# Patient Record
Sex: Female | Born: 1965 | Race: White | Hispanic: No | Marital: Married | Smoking: Never smoker
Health system: Southern US, Community
[De-identification: ages and names within clinical notes are randomized; demographics above are authoritative.]

---

## 2015-07-22 ENCOUNTER — Emergency Department (HOSPITAL_COMMUNITY)
Admission: EM | Admit: 2015-07-22 | Discharge: 2015-07-22 | Disposition: A | Discharge status: Home / Self Care | Payer: Self-pay | Attending: Emergency Medicine | Admitting: Emergency Medicine

## 2015-07-22 ENCOUNTER — Encounter (HOSPITAL_COMMUNITY): Payer: Self-pay | Admitting: Emergency Medicine

## 2015-07-22 DIAGNOSIS — Z046 Encounter for general psychiatric examination, requested by authority: Secondary | ICD-10-CM | POA: Insufficient documentation

## 2015-07-22 DIAGNOSIS — Z008 Encounter for other general examination: Secondary | ICD-10-CM

## 2015-07-22 LAB — URINALYSIS, ROUTINE W REFLEX MICROSCOPIC
Bilirubin Urine: NEGATIVE
Glucose, UA: NEGATIVE mg/dL
Hgb urine dipstick: NEGATIVE
KETONES UR: NEGATIVE mg/dL
LEUKOCYTES UA: NEGATIVE
NITRITE: NEGATIVE
PH: 5.5 (ref 5.0–8.0)
Protein, ur: NEGATIVE mg/dL
SPECIFIC GRAVITY, URINE: 1.02 (ref 1.005–1.030)

## 2015-07-22 LAB — CBC
HCT: 43.2 % (ref 36.0–46.0)
Hemoglobin: 14 g/dL (ref 12.0–15.0)
MCH: 29.9 pg (ref 26.0–34.0)
MCHC: 32.4 g/dL (ref 30.0–36.0)
MCV: 92.3 fL (ref 78.0–100.0)
PLATELETS: 241 10*3/uL (ref 150–400)
RBC: 4.68 MIL/uL (ref 3.87–5.11)
RDW: 14.1 % (ref 11.5–15.5)
WBC: 8.1 10*3/uL (ref 4.0–10.5)

## 2015-07-22 LAB — RAPID URINE DRUG SCREEN, HOSP PERFORMED
Amphetamines: NOT DETECTED
Barbiturates: NOT DETECTED
Benzodiazepines: NOT DETECTED
Cocaine: NOT DETECTED
OPIATES: NOT DETECTED
TETRAHYDROCANNABINOL: NOT DETECTED

## 2015-07-22 LAB — COMPREHENSIVE METABOLIC PANEL
ALBUMIN: 3.9 g/dL (ref 3.5–5.0)
ALT: 32 U/L (ref 14–54)
AST: 29 U/L (ref 15–41)
Alkaline Phosphatase: 53 U/L (ref 38–126)
Anion gap: 6 (ref 5–15)
BUN: 10 mg/dL (ref 6–20)
CALCIUM: 8.9 mg/dL (ref 8.9–10.3)
CO2: 24 mmol/L (ref 22–32)
CREATININE: 0.54 mg/dL (ref 0.44–1.00)
Chloride: 108 mmol/L (ref 101–111)
Glucose, Bld: 101 mg/dL — ABNORMAL HIGH (ref 65–99)
Potassium: 3.9 mmol/L (ref 3.5–5.1)
SODIUM: 138 mmol/L (ref 135–145)
Total Bilirubin: 0.8 mg/dL (ref 0.3–1.2)
Total Protein: 7.3 g/dL (ref 6.5–8.1)

## 2015-07-22 LAB — SALICYLATE LEVEL

## 2015-07-22 LAB — ACETAMINOPHEN LEVEL: Acetaminophen (Tylenol), Serum: <10 ug/mL — ABNORMAL LOW (ref 10–30)

## 2015-07-22 LAB — ETHANOL

## 2015-07-22 NOTE — ED Notes (Signed)
Per paperwork-states patient has been diagnosed with psychiatric disorder-[facility]sent here for blood work-patient refusing labs and triage process-states it is against her faith

## 2015-07-22 NOTE — ED Provider Notes (Signed)
CSN: [id number]     Arrival date & time 07/22/15  1114 History   First MD Initiated Contact with Patient 07/22/15 1138     Chief Complaint  Patient presents with  . Medical Clearance     (Consider location/radiation/quality/duration/timing/severity/associated sxs/prior Treatment) Patient is a 50 y.o. female presenting with mental health disorder. The history is provided by the patient. No language interpreter was used.  Mental Health Problem Presenting symptoms: agitation   Patient accompanied by:  Law enforcement Degree of incapacity (severity):  Moderate Onset quality:  Gradual Timing:  Constant Progression:  Worsening Chronicity:  New Context: not noncompliant   Treatment compliance:  Untreated Relieved by:  Nothing Worsened by:  Nothing tried Associated symptoms: no abdominal pain   Risk factors: no family hx of mental illness   Pt here for lab clearance for mental health treatment.    History reviewed. No pertinent past medical history. History reviewed. No pertinent past surgical history. No family history on file. Social History  Substance Use Topics  . Smoking status: Never Smoker   . Smokeless tobacco: None  . Alcohol Use: No   OB History    No data available     Review of Systems  Gastrointestinal: Negative for abdominal pain.  Psychiatric/Behavioral: Positive for agitation.  All other systems reviewed and are negative.     Allergies  Review of patient's allergies indicates no known allergies.  Home Medications   Prior to Admission medications   Not on File   BP 142/100 mmHg  Pulse 66  Temp(Src) 97.6 F (36.4 C) (Oral)  Resp 16  SpO2 96% Physical Exam  Constitutional: She is oriented to person, place, and time. She appears well-developed and well-nourished.  HENT:  Head: Normocephalic.  Right Ear: External ear normal.  Left Ear: External ear normal.  Nose: Nose normal.  Mouth/Throat: Oropharynx is clear and moist.  Eyes: Conjunctivae and  EOM are normal. Pupils are equal, round, and reactive to light.  Neck: Normal range of motion.  Cardiovascular: Normal rate and intact distal pulses.   Pulmonary/Chest: Effort normal.  Abdominal: Soft. She exhibits no distension.  Musculoskeletal: Normal range of motion.  Neurological: She is alert and oriented to person, place, and time.  Psychiatric: She has a normal mood and affect.  Nursing note and vitals reviewed.   ED Course  Procedures (including critical care time) Labs Review Labs Reviewed  COMPREHENSIVE METABOLIC PANEL - Abnormal; Notable for the following:    Glucose, Bld 101 (*)    All other components within normal limits  ACETAMINOPHEN LEVEL - Abnormal; Notable for the following:    Acetaminophen (Tylenol), Serum <10 (*)    All other components within normal limits  URINALYSIS, ROUTINE W REFLEX MICROSCOPIC (NOT AT [facility]) - Abnormal; Notable for the following:    APPearance CLOUDY (*)    All other components within normal limits  ETHANOL  SALICYLATE LEVEL  CBC  URINE RAPID DRUG SCREEN, HOSP PERFORMED    Imaging Review No results found. I have personally reviewed and evaluated these images and lab results as part of my medical decision-making.   EKG Interpretation None      MDM  Pt denies any health problems.  Denies overdose, no current medications   Final diagnoses:  Medical clearance for psychiatric admission        Elson AreasLeslie K Sofia, Cordelia Poche-C 07/22/15 1311  Azalia BilisKevin Campos, MD 07/22/15 [id number]

## 2015-08-14 ENCOUNTER — Encounter (HOSPITAL_COMMUNITY): Payer: Self-pay | Admitting: Emergency Medicine

## 2015-08-14 ENCOUNTER — Emergency Department (HOSPITAL_COMMUNITY)
Admission: EM | Admit: 2015-08-14 | Discharge: 2015-08-16 | Disposition: A | Discharge status: Other Acute Inpatient Hospital | Payer: Self-pay | Attending: Emergency Medicine | Admitting: Emergency Medicine

## 2015-08-14 DIAGNOSIS — R4689 Other symptoms and signs involving appearance and behavior: Secondary | ICD-10-CM

## 2015-08-14 DIAGNOSIS — R45851 Suicidal ideations: Secondary | ICD-10-CM | POA: Insufficient documentation

## 2015-08-14 DIAGNOSIS — R4589 Other symptoms and signs involving emotional state: Secondary | ICD-10-CM

## 2015-08-14 DIAGNOSIS — F323 Major depressive disorder, single episode, severe with psychotic features: Secondary | ICD-10-CM | POA: Diagnosis present

## 2015-08-14 DIAGNOSIS — F322 Major depressive disorder, single episode, severe without psychotic features: Secondary | ICD-10-CM

## 2015-08-14 DIAGNOSIS — R4587 Impulsiveness: Secondary | ICD-10-CM | POA: Insufficient documentation

## 2015-08-14 DIAGNOSIS — F333 Major depressive disorder, recurrent, severe with psychotic symptoms: Secondary | ICD-10-CM | POA: Diagnosis present

## 2015-08-14 LAB — CBC
HEMATOCRIT: 41.2 % (ref 36.0–46.0)
Hemoglobin: 13.7 g/dL (ref 12.0–15.0)
MCH: 30 pg (ref 26.0–34.0)
MCHC: 33.3 g/dL (ref 30.0–36.0)
MCV: 90.2 fL (ref 78.0–100.0)
PLATELETS: 242 10*3/uL (ref 150–400)
RBC: 4.57 MIL/uL (ref 3.87–5.11)
RDW: 13.8 % (ref 11.5–15.5)
WBC: 8.9 10*3/uL (ref 4.0–10.5)

## 2015-08-14 LAB — COMPREHENSIVE METABOLIC PANEL
ALBUMIN: 4.2 g/dL (ref 3.5–5.0)
ALK PHOS: 56 U/L (ref 38–126)
ALT: 29 U/L (ref 14–54)
AST: 27 U/L (ref 15–41)
Anion gap: 6 (ref 5–15)
BILIRUBIN TOTAL: 0.6 mg/dL (ref 0.3–1.2)
BUN: 12 mg/dL (ref 6–20)
CO2: 24 mmol/L (ref 22–32)
CREATININE: 0.56 mg/dL (ref 0.44–1.00)
Calcium: 8.8 mg/dL — ABNORMAL LOW (ref 8.9–10.3)
Chloride: 108 mmol/L (ref 101–111)
GFR calc Af Amer: >60 mL/min (ref >60)
GLUCOSE: 121 mg/dL — AB (ref 65–99)
Potassium: 3.4 mmol/L — ABNORMAL LOW (ref 3.5–5.1)
Sodium: 138 mmol/L (ref 135–145)
TOTAL PROTEIN: 7.4 g/dL (ref 6.5–8.1)

## 2015-08-14 LAB — ETHANOL

## 2015-08-14 LAB — I-STAT BETA HCG BLOOD, ED (MC, WL, AP ONLY): I-stat hCG, quantitative: <5 m[IU]/mL (ref <5)

## 2015-08-14 LAB — ACETAMINOPHEN LEVEL: Acetaminophen (Tylenol), Serum: <10 ug/mL — ABNORMAL LOW (ref 10–30)

## 2015-08-14 LAB — SALICYLATE LEVEL: Salicylate Lvl: <4 mg/dL (ref 2.8–30.0)

## 2015-08-14 MED ORDER — POTASSIUM CHLORIDE CRYS ER 20 MEQ PO TBCR
40.0000 meq | EXTENDED_RELEASE_TABLET | Freq: Once | ORAL | Status: DC
  Filled 2015-08-14: qty 2
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
## 2015-08-14 NOTE — Progress Notes (Addendum)
CSW faxed pt to the following facilities in order to try and seek inpatient placement:   [facility]  [place]  [facility] [place] [phone] ED CSW 08/14/2015 10:30 PM

## 2015-08-14 NOTE — ED Notes (Signed)
Pt placed in paper scrubs and wanded by security. Two bags labeled and placed at the nursing station.
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
## 2015-08-14 NOTE — ED Notes (Signed)
Counselor at bedside.

## 2015-08-14 NOTE — ED Provider Notes (Signed)
CSN: [id number]     Arrival date & time 08/14/15  2025 History   First MD Initiated Contact with Patient 08/14/15 2044     Chief Complaint  Patient presents with  . IVC      (Consider location/radiation/quality/duration/timing/severity/associated sxs/prior Treatment) HPI Comments: This is a 50 year old female brought in by [employer] after being IVC by her spouse works states that patient has been expressing suicidality, stating she can't take it anymore and walk around the neighborhood with a knife.  She could be using controlled substances, but that is unclear when [employer] went to bring her to the hospital, she walked out into traffic.  The police officer and was generally combative on arrival to the emergency department.  She states that she does not have any mental health issues that she act as is the same in religion and giving blood or urine is against her religion.  The history is provided by the patient.    History reviewed. No pertinent past medical history. History reviewed. No pertinent past surgical history. History reviewed. No pertinent family history. Social History  Substance Use Topics  . Smoking status: Never Smoker   . Smokeless tobacco: None  . Alcohol Use: No   OB History    No data available     Review of Systems  Constitutional: Negative for fever and chills.  HENT: Positive for dental problem.   Respiratory: Negative for shortness of breath.   Cardiovascular: Negative for chest pain.  Psychiatric/Behavioral: Positive for behavioral problems. Negative for suicidal ideas and self-injury. The patient is not nervous/anxious.       Allergies  Penicillins  Home Medications   Prior to Admission medications   Not on File   BP 108/54 mmHg  Pulse 78  Temp(Src) 98.1 F (36.7 C) (Oral)  Resp 18  SpO2 100%  LMP 06/25/2015 (Approximate) Physical Exam  Constitutional: She appears well-developed and well-nourished.  HENT:  Head: Normocephalic.  Eyes: Pupils are  equal, round, and reactive to light.  Neck: Normal range of motion.  Cardiovascular: Normal rate and regular rhythm.   Pulmonary/Chest: Effort normal.  Musculoskeletal: Normal range of motion.  Neurological: She is alert.  Skin: Skin is warm and dry.  Psychiatric: Her affect is inappropriate. Her speech is tangential. She is agitated. She expresses impulsivity and inappropriate judgment. She expresses no homicidal and no suicidal ideation. She expresses no suicidal plans and no homicidal plans. She exhibits abnormal recent memory.  Nursing note and vitals reviewed.   ED Course  Procedures (including critical care time) Labs Review Labs Reviewed  COMPREHENSIVE METABOLIC PANEL - Abnormal; Notable for the following:    Potassium 3.4 (*)    Glucose, Bld 121 (*)    Calcium 8.8 (*)    All other components within normal limits  ACETAMINOPHEN LEVEL - Abnormal; Notable for the following:    Acetaminophen (Tylenol), Serum <10 (*)    All other components within normal limits  ETHANOL  SALICYLATE LEVEL  CBC  URINE RAPID DRUG SCREEN, HOSP PERFORMED  I-STAT BETA HCG BLOOD, ED ([facility], [facility], [facility] ONLY)    Imaging Review No results found. I have personally reviewed and evaluated these images and lab results as part of my medical decision-making.   EKG Interpretation None    patient did agree to have blood work and urine when she was taken to the restroom.  She proceeded to urinate in a cup, but then drank the contents Patient finally did provide urine and blood samples.  She  was found to be slightly hypokalemic.  This has been supplemented in the emergency room with by mouth potassium.  She's been evaluated by TTS and is awaiting psychiatric evaluation in the morning  MDM   Final diagnoses:  Suicidal behavior         Earley Favor, NP 08/15/15 0408  Marily Memos, MD 08/15/15 1159
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
## 2015-08-15 DIAGNOSIS — F322 Major depressive disorder, single episode, severe without psychotic features: Secondary | ICD-10-CM

## 2015-08-15 DIAGNOSIS — R45851 Suicidal ideations: Secondary | ICD-10-CM

## 2015-08-15 DIAGNOSIS — F323 Major depressive disorder, single episode, severe with psychotic features: Secondary | ICD-10-CM | POA: Diagnosis present

## 2015-08-15 LAB — RAPID URINE DRUG SCREEN, HOSP PERFORMED
Amphetamines: NOT DETECTED
BARBITURATES: NOT DETECTED
Benzodiazepines: NOT DETECTED
Cocaine: NOT DETECTED
Opiates: NOT DETECTED
Tetrahydrocannabinol: NOT DETECTED

## 2015-08-15 MED ORDER — BENZTROPINE MESYLATE 1 MG PO TABS
0.5000 mg | ORAL_TABLET | Freq: Two times a day (BID) | ORAL | Status: DC
  Filled 2015-08-15 (×3): qty 1

## 2015-08-15 MED ORDER — OLANZAPINE 10 MG PO TBDP
10.0000 mg | ORAL_TABLET | Freq: Three times a day (TID) | ORAL | Status: DC | PRN
  Filled 2015-08-15: qty 1

## 2015-08-15 MED ORDER — CITALOPRAM HYDROBROMIDE 10 MG PO TABS
10.0000 mg | ORAL_TABLET | Freq: Every day | ORAL | Status: DC
  Filled 2015-08-15 (×2): qty 1

## 2015-08-15 MED ORDER — HALOPERIDOL 1 MG PO TABS
1.0000 mg | ORAL_TABLET | Freq: Two times a day (BID) | ORAL | Status: DC
  Filled 2015-08-15 (×3): qty 1

## 2015-08-15 NOTE — BH Assessment (Addendum)
[facility] Assessment Progress Note  Per Thedore MinsMojeed Akintayo, MD, this pt requires psychiatric hospitalization at this time.  The following facilities have been contacted to seek placement for this pt, with results as noted:  Beds available, information sent, decision pending:  [facility]   [facility]  [facility]   Daeshon Grammatico, [place] Triage Specialist [phone]

## 2015-08-15 NOTE — ED Notes (Signed)
Pt refusing to take medication. "I don't take medication." Pt laughing inappropriately at times. Concrete thinking, argumentative when asked assessment questions. Special checks q 15 mins in place for safety. Video monitoring in place.

## 2015-08-15 NOTE — Consult Note (Signed)
[place] Psychiatry Consult   Reason for Consult:  Aggression, depression, psychosis Referring Physician:  EDP Patient Identification: Heidi Tate MRN:  [id number] Principal Diagnosis: Severe major depression, single episode, with psychotic features [facility]) Diagnosis:   Patient Active Problem List   Diagnosis Date Noted  . Severe major depression, single episode, with psychotic features ([place]) [F32.3] 08/15/2015    Priority: High  . Severe major depression, single episode, without psychotic features ([place]) [F32.2] 08/15/2015    Total Time spent with patient: 45 minutes  Subjective:   Heidi Tate is a 50 y.o. female patient admitted with bizarre behavior, psychosis.  HPI:  This is a 50 year old female with history of psychosis who was  brought to [facility] by [facility] after being IVC by her spouse. Patient reportedly expressed suicidal thoughts, was acting bizarre, walking around the neighborhood with a knife and walking into the traffic.Patient states that "I can't take it anymore'', verbalizes being hopeless, depressed and tired of living. Collateral information from husband reports that patient was acting irrationally and psychotic. Patient reportedly drank her own urine last night, she is oppositional, defiant and argumentative.  Patient is a poor historian and unable to contract for safety.  Past Psychiatric History: as above  Risk to Self: Suicidal Ideation: Yes-Currently Present Suicidal Intent: Yes-Currently Present Is patient at risk for suicide?: Yes Suicidal Plan?: Yes-Currently Present ("Maybe lightening will strike") Specify Current Suicidal Plan:  (patient denies but then sts, "Maybe lightening will strik me) Access to Means: No What has been your use of drugs/alcohol within the last 12 months?:  (patient sts, " I have done all kind of drugs" unk what kind) How many times?:  (0) Other Self Harm Risks:  (n/a) Triggers for Past Attempts:  (no prevous attempts of  gestures ) Intentional Self Injurious Behavior: None Risk to Others: Homicidal Ideation: No Thoughts of Harm to Others: No Current Homicidal Intent: No Current Homicidal Plan: No Access to Homicidal Means: No Identified Victim:  (n/a) History of harm to others?: No Assessment of Violence: None Noted Violent Behavior Description:  (Uncooperative at times) Does patient have access to weapons?: No Criminal Charges Pending?: No Does patient have a court date: No Prior Inpatient Therapy: Prior Inpatient Therapy: Yes Prior Therapy Facilty/Provider(s):  ("[place] and multiple places I can remember") Reason for Treatment:  ( IVC'd by family ) Prior Outpatient Therapy: Prior Outpatient Therapy: No Prior Therapy Dates:  (n/a) Prior Therapy Facilty/Provider(s):  (n/a) Reason for Treatment:  (n/a) Does patient have an ACCT team?: No Does patient have Intensive In-House Services?  : No Does patient have [facility] services? : No Does patient have P4CC services?: No  Past Medical History: History reviewed. No pertinent past medical history. History reviewed. No pertinent past surgical history. Family History: History reviewed. No pertinent family history. Family Psychiatric  History:  Social History:  History  Alcohol Use No     History  Drug Use Not on file    Social History   Social History  . Marital Status: Married    Spouse Name: N/A  . Number of Children: N/A  . Years of Education: N/A   Social History Main Topics  . Smoking status: Never Smoker   . Smokeless tobacco: None  . Alcohol Use: No  . Drug Use: None  . Sexual Activity: Not Asked   Other Topics Concern  . None   Social History Narrative   Additional Social History:    Allergies:   Allergies  Allergen Reactions  . Penicillins Other (See Comments)    Reaction: unknown  Has patient had a PCN reaction causing immediate rash, facial/tongue/throat swelling, SOB or lightheadedness with hypotension:  n/a Has patient had a PCN reaction causing severe rash involving mucus membranes or skin necrosis:  n/a Has patient had a PCN reaction that required hospitalization: n/a Has patient had a PCN reaction occurring within the last 10 years: n/a If all of the above answers are "NO", then may proceed with Cephalosporin use.     Labs:  Results for orders placed or performed during the hospital encounter of 08/14/15 (from the past 48 hour(s))  Comprehensive metabolic panel     Status: Abnormal   Collection Time: 08/14/15  9:04 PM  Result Value Ref Range   Sodium 138 135 - 145 mmol/L   Potassium 3.4 (L) 3.5 - 5.1 mmol/L   Chloride 108 101 - 111 mmol/L   CO2 24 22 - 32 mmol/L   Glucose, Bld 121 (H) 65 - 99 mg/dL   BUN 12 6 - 20 mg/dL   Creatinine, Ser 0.56 0.44 - 1.00 mg/dL   Calcium 8.8 (L) 8.9 - 10.3 mg/dL   Total Protein 7.4 6.5 - 8.1 g/dL   Albumin 4.2 3.5 - 5.0 g/dL   AST 27 15 - 41 U/L   ALT 29 14 - 54 U/L   Alkaline Phosphatase 56 38 - 126 U/L   Total Bilirubin 0.6 0.3 - 1.2 mg/dL   GFR calc non Af Amer >60 >60 mL/min   GFR calc Af Amer >60 >60 mL/min    Comment: (NOTE) The eGFR has been calculated using the CKD EPI equation. This calculation has not been validated in all clinical situations. eGFR's persistently <60 mL/min signify possible Chronic Kidney Disease.    Anion gap 6 5 - 15  Ethanol     Status: None   Collection Time: 08/14/15  9:04 PM  Result Value Ref Range   Alcohol, Ethyl (B) <5 <5 mg/dL    Comment:        LOWEST DETECTABLE LIMIT FOR SERUM ALCOHOL IS 5 mg/dL FOR MEDICAL PURPOSES ONLY   Salicylate level     Status: None   Collection Time: 08/14/15  9:04 PM  Result Value Ref Range   Salicylate Lvl <4.6 2.8 - 30.0 mg/dL  Acetaminophen level     Status: Abnormal   Collection Time: 08/14/15  9:04 PM  Result Value Ref Range   Acetaminophen (Tylenol), Serum <10 (L) 10 - 30 ug/mL    Comment:        THERAPEUTIC CONCENTRATIONS VARY SIGNIFICANTLY. A RANGE OF  10-30 ug/mL MAY BE AN EFFECTIVE CONCENTRATION FOR MANY PATIENTS. HOWEVER, SOME ARE BEST TREATED AT CONCENTRATIONS OUTSIDE THIS RANGE. ACETAMINOPHEN CONCENTRATIONS >150 ug/mL AT 4 HOURS AFTER INGESTION AND >50 ug/mL AT 12 HOURS AFTER INGESTION ARE OFTEN ASSOCIATED WITH TOXIC REACTIONS.   cbc     Status: None   Collection Time: 08/14/15  9:04 PM  Result Value Ref Range   WBC 8.9 4.0 - 10.5 K/uL   RBC 4.57 3.87 - 5.11 MIL/uL   Hemoglobin 13.7 12.0 - 15.0 g/dL   HCT 41.2 36.0 - 46.0 %   MCV 90.2 78.0 - 100.0 fL   MCH 30.0 26.0 - 34.0 pg   MCHC 33.3 30.0 - 36.0 g/dL   RDW 13.8 11.5 - 15.5 %   Platelets 242 150 - 400 K/uL  I-Stat beta hCG blood, ED     Status: None  Collection Time: 08/14/15  9:10 PM  Result Value Ref Range   I-stat hCG, quantitative <5.0 <5 mIU/mL   Comment 3            Comment:   GEST. AGE      CONC.  (mIU/mL)   <=1 WEEK        5 - 50     2 WEEKS       50 - 500     3 WEEKS       100 - 10,000     4 WEEKS     1,000 - 30,000        FEMALE AND NON-PREGNANT FEMALE:     LESS THAN 5 mIU/mL     Current Facility-Administered Medications  Medication Dose Route Frequency Provider Last Rate Last Dose  . benztropine (COGENTIN) tablet 0.5 mg  0.5 mg Oral BID Corena Pilgrim, MD      . citalopram (CELEXA) tablet 10 mg  10 mg Oral Daily Abbigayle Toole, MD      . haloperidol (HALDOL) tablet 1 mg  1 mg Oral BID Lissie Hinesley, MD      . OLANZapine zydis (ZYPREXA) disintegrating tablet 10 mg  10 mg Oral Q8H PRN Raynelle Fujikawa, MD      . potassium chloride SA (K-DUR,KLOR-CON) CR tablet 40 mEq  40 mEq Oral Once Junius Creamer, NP   40 mEq at 08/14/15 2258   No current outpatient prescriptions on file.    Musculoskeletal: Strength & Muscle Tone: within normal limits Gait & Station: normal Patient leans: N/A  Psychiatric Specialty Exam: Physical Exam  Psychiatric: Her affect is angry and labile. Her speech is tangential. She is agitated and aggressive. Thought content  is paranoid. Cognition and memory are normal. She expresses impulsivity. She exhibits a depressed mood.    Review of Systems  Constitutional: Negative.   HENT: Negative.   Eyes: Negative.   Respiratory: Negative.   Cardiovascular: Negative.   Gastrointestinal: Negative.   Genitourinary: Negative.   Musculoskeletal: Negative.   Skin: Negative.   Endo/Heme/Allergies: Negative.   Psychiatric/Behavioral: Positive for depression and hallucinations. The patient is nervous/anxious.     Blood pressure 108/54, pulse 62, temperature 98.1 F (36.7 C), temperature source Oral, resp. rate 17, last menstrual period 06/25/2015, SpO2 97 %.There is no height or weight on file to calculate BMI.  General Appearance: Casual  Eye Contact:  Good  Speech:  Clear and Coherent and Pressured  Volume:  Increased  Mood:  Angry and Irritable  Affect:  Labile  Thought Process:  Disorganized  Orientation:  Full (Time, Place, and Person)  Thought Content:  Delusions and Hallucinations: Auditory  Suicidal Thoughts:  Yes.  without intent/plan  Homicidal Thoughts:  No  Memory:  Immediate;   Fair Recent;   Fair Remote;   Good  Judgement:  Impaired  Insight:  Lacking  Psychomotor Activity:  Increased  Concentration:  Concentration: Fair and Attention Span: Fair  Recall:  [employer] of Knowledge:  Fair  Language:  Fair  Akathisia:  No  Handed:  Right  AIMS (if indicated):     Assets:  Social Support  ADL's:  Intact  Cognition:  WNL  Sleep:   poor     Treatment Plan Summary: -Crisis stabilization -Daily contact with patient to assess and evaluate symptoms and progress in treatment and Medication management. -Start Celexa 54m daily for depression -Start Haloperidol 181mbid for psychosis -Start Cogentin 0.3m32mid for EPS prevention  Disposition:  Recommend psychiatric Inpatient admission when medically cleared. Supportive therapy provided about ongoing stressors.  Corena Pilgrim, MD 08/15/2015  11:25 AM

## 2015-08-15 NOTE — ED Notes (Signed)
Pt given specimen cup for urine sample

## 2015-08-15 NOTE — ED Notes (Signed)
Pt urinated in specimen cup, refused to submit urine for testing.  MHT Vanita witnessed pt drinking urine.

## 2015-08-16 DIAGNOSIS — F333 Major depressive disorder, recurrent, severe with psychotic symptoms: Secondary | ICD-10-CM | POA: Diagnosis present

## 2015-08-16 NOTE — ED Provider Notes (Signed)
  Physical Exam  BP 132/90 mmHg  Pulse 84  Temp(Src) 98.1 F (36.7 C) (Oral)  Resp 20  SpO2 98%  LMP 06/25/2015 (Approximate)  Physical Exam  ED Course  Procedures  MDM Accepted at [facility]. Dr Jeannine KittenFarah.      Heidi CoreNathan Farhad Burleson, MD 08/16/15 [phone]

## 2015-08-16 NOTE — ED Notes (Signed)
Patient slept well but refused all her nighttime medications.

## 2015-08-16 NOTE — Consult Note (Signed)
[place] Psychiatry Consult   Reason for Consult:  Aggression, depression, psychosis Referring Physician:  EDP Patient Identification: Heidi Tate MRN:  [id number] Principal Diagnosis: Severe major depression, recurrent, with psychosis Diagnosis:   Patient Active Problem List   Diagnosis Date Noted  . Severe major depression, single episode, without psychotic features ([place]) [F32.2] 08/15/2015  . Severe major depression, single episode, with psychotic features ([place]) [F32.3] 08/15/2015    Total Time spent with patient: 30 minutes  Subjective:   Heidi Tate is a 50 y.o. female patient admitted with bizarre behavior, psychosis.  HPI:  This is a 50 year old female with history of psychosis who was  brought to [facility] by [facility] after being IVC by her spouse. Patient reportedly expressed suicidal thoughts, was acting bizarre, walking around the neighborhood with a knife and walking into the traffic.Patient states that "I can't take it anymore'', verbalizes being hopeless, depressed and tired of living. Collateral information from husband reports that patient was acting irrationally and psychotic. Patient reportedly drank her own urine last night, she is oppositional, defiant and argumentative.  Patient is a poor historian and unable to contract for safety.  Today:  The patient continues to refuse medications and be argumentative.  "I don't take medications, don't want to take medications.  I will tell you I am going to take them but I won't."  Requests a lawyer when a long explanation of the reasons she needs to be on medications due to her dangerous behaviors and threats to others.  2 MDs will sign for forced medications today.  Past Psychiatric History: psychosis, depression  Risk to Self: Suicidal Ideation: Yes-Currently Present Suicidal Intent: Yes-Currently Present Is patient at risk for suicide?: Yes Suicidal Plan?: Yes-Currently Present ("Maybe lightening will  strike") Specify Current Suicidal Plan:  (patient denies but then sts, "Maybe lightening will strik me) Access to Means: No What has been your use of drugs/alcohol within the last 12 months?:  (patient sts, " I have done all kind of drugs" unk what kind) How many times?:  (0) Other Self Harm Risks:  (n/a) Triggers for Past Attempts:  (no prevous attempts of gestures ) Intentional Self Injurious Behavior: None Risk to Others: Homicidal Ideation: No Thoughts of Harm to Others: No Current Homicidal Intent: No Current Homicidal Plan: No Access to Homicidal Means: No Identified Victim:  (n/a) History of harm to others?: No Assessment of Violence: None Noted Violent Behavior Description:  (Uncooperative at times) Does patient have access to weapons?: No Criminal Charges Pending?: No Does patient have a court date: No Prior Inpatient Therapy: Prior Inpatient Therapy: Yes Prior Therapy Facilty/Provider(s):  ("[place] and multiple places I can remember") Reason for Treatment:  ( IVC'd by family ) Prior Outpatient Therapy: Prior Outpatient Therapy: No Prior Therapy Dates:  (n/a) Prior Therapy Facilty/Provider(s):  (n/a) Reason for Treatment:  (n/a) Does patient have an ACCT team?: No Does patient have Intensive In-House Services?  : No Does patient have [facility] services? : No Does patient have P4CC services?: No  Past Medical History: History reviewed. No pertinent past medical history. History reviewed. No pertinent past surgical history. Family History: History reviewed. No pertinent family history. Family Psychiatric  History:  Social History:  History  Alcohol Use No     History  Drug Use Not on file    Social History   Social History  . Marital Status: Married    Spouse Name: N/A  . Number of Children: N/A  . Years  of Education: N/A   Social History Main Topics  . Smoking status: Never Smoker   . Smokeless tobacco: None  . Alcohol Use: No  . Drug Use:  None  . Sexual Activity: Not Asked   Other Topics Concern  . None   Social History Narrative   Additional Social History:    Allergies:   Allergies  Allergen Reactions  . Penicillins Other (See Comments)    Reaction: unknown  Has patient had a PCN reaction causing immediate rash, facial/tongue/throat swelling, SOB or lightheadedness with hypotension: n/a Has patient had a PCN reaction causing severe rash involving mucus membranes or skin necrosis:  n/a Has patient had a PCN reaction that required hospitalization: n/a Has patient had a PCN reaction occurring within the last 10 years: n/a If all of the above answers are "NO", then may proceed with Cephalosporin use.     Labs:  Results for orders placed or performed during the hospital encounter of 08/14/15 (from the past 48 hour(s))  Comprehensive metabolic panel     Status: Abnormal   Collection Time: 08/14/15  9:04 PM  Result Value Ref Range   Sodium 138 135 - 145 mmol/L   Potassium 3.4 (L) 3.5 - 5.1 mmol/L   Chloride 108 101 - 111 mmol/L   CO2 24 22 - 32 mmol/L   Glucose, Bld 121 (H) 65 - 99 mg/dL   BUN 12 6 - 20 mg/dL   Creatinine, Ser 0.56 0.44 - 1.00 mg/dL   Calcium 8.8 (L) 8.9 - 10.3 mg/dL   Total Protein 7.4 6.5 - 8.1 g/dL   Albumin 4.2 3.5 - 5.0 g/dL   AST 27 15 - 41 U/L   ALT 29 14 - 54 U/L   Alkaline Phosphatase 56 38 - 126 U/L   Total Bilirubin 0.6 0.3 - 1.2 mg/dL   GFR calc non Af Amer >60 >60 mL/min   GFR calc Af Amer >60 >60 mL/min    Comment: (NOTE) The eGFR has been calculated using the CKD EPI equation. This calculation has not been validated in all clinical situations. eGFR's persistently <60 mL/min signify possible Chronic Kidney Disease.    Anion gap 6 5 - 15  Ethanol     Status: None   Collection Time: 08/14/15  9:04 PM  Result Value Ref Range   Alcohol, Ethyl (B) <5 <5 mg/dL    Comment:        LOWEST DETECTABLE LIMIT FOR SERUM ALCOHOL IS 5 mg/dL FOR MEDICAL PURPOSES ONLY   Salicylate  level     Status: None   Collection Time: 08/14/15  9:04 PM  Result Value Ref Range   Salicylate Lvl <7.5 2.8 - 30.0 mg/dL  Acetaminophen level     Status: Abnormal   Collection Time: 08/14/15  9:04 PM  Result Value Ref Range   Acetaminophen (Tylenol), Serum <10 (L) 10 - 30 ug/mL    Comment:        THERAPEUTIC CONCENTRATIONS VARY SIGNIFICANTLY. A RANGE OF 10-30 ug/mL MAY BE AN EFFECTIVE CONCENTRATION FOR MANY PATIENTS. HOWEVER, SOME ARE BEST TREATED AT CONCENTRATIONS OUTSIDE THIS RANGE. ACETAMINOPHEN CONCENTRATIONS >150 ug/mL AT 4 HOURS AFTER INGESTION AND >50 ug/mL AT 12 HOURS AFTER INGESTION ARE OFTEN ASSOCIATED WITH TOXIC REACTIONS.   cbc     Status: None   Collection Time: 08/14/15  9:04 PM  Result Value Ref Range   WBC 8.9 4.0 - 10.5 K/uL   RBC 4.57 3.87 - 5.11 MIL/uL   Hemoglobin 13.7 12.0 -  15.0 g/dL   HCT 41.2 36.0 - 46.0 %   MCV 90.2 78.0 - 100.0 fL   MCH 30.0 26.0 - 34.0 pg   MCHC 33.3 30.0 - 36.0 g/dL   RDW 13.8 11.5 - 15.5 %   Platelets 242 150 - 400 K/uL  I-Stat beta hCG blood, ED     Status: None   Collection Time: 08/14/15  9:10 PM  Result Value Ref Range   I-stat hCG, quantitative <5.0 <5 mIU/mL   Comment 3            Comment:   GEST. AGE      CONC.  (mIU/mL)   <=1 WEEK        5 - 50     2 WEEKS       50 - 500     3 WEEKS       100 - 10,000     4 WEEKS     1,000 - 30,000        FEMALE AND NON-PREGNANT FEMALE:     LESS THAN 5 mIU/mL   Rapid urine drug screen (hospital performed)     Status: None   Collection Time: 08/15/15  3:16 PM  Result Value Ref Range   Opiates NONE DETECTED NONE DETECTED   Cocaine NONE DETECTED NONE DETECTED   Benzodiazepines NONE DETECTED NONE DETECTED   Amphetamines NONE DETECTED NONE DETECTED   Tetrahydrocannabinol NONE DETECTED NONE DETECTED   Barbiturates NONE DETECTED NONE DETECTED    Comment:        DRUG SCREEN FOR MEDICAL PURPOSES ONLY.  IF CONFIRMATION IS NEEDED FOR ANY PURPOSE, NOTIFY LAB WITHIN 5 DAYS.         LOWEST DETECTABLE LIMITS FOR URINE DRUG SCREEN Drug Class       Cutoff (ng/mL) Amphetamine      1000 Barbiturate      200 Benzodiazepine   161 Tricyclics       096 Opiates          300 Cocaine          300 THC              50     Current Facility-Administered Medications  Medication Dose Route Frequency Provider Last Rate Last Dose  . benztropine (COGENTIN) tablet 0.5 mg  0.5 mg Oral BID Corena Pilgrim, MD   0.5 mg at 08/15/15 1256  . citalopram (CELEXA) tablet 10 mg  10 mg Oral Daily Tehillah Cipriani, MD   10 mg at 08/15/15 1256  . haloperidol (HALDOL) tablet 1 mg  1 mg Oral BID Corena Pilgrim, MD   1 mg at 08/15/15 1256  . OLANZapine zydis (ZYPREXA) disintegrating tablet 10 mg  10 mg Oral Q8H PRN Haeven Nickle, MD      . potassium chloride SA (K-DUR,KLOR-CON) CR tablet 40 mEq  40 mEq Oral Once Junius Creamer, NP   40 mEq at 08/14/15 2258   No current outpatient prescriptions on file.    Musculoskeletal: Strength & Muscle Tone: within normal limits Gait & Station: normal Patient leans: N/A  Psychiatric Specialty Exam: Physical Exam  Constitutional: She is oriented to person, place, and time. She appears well-developed and well-nourished.  HENT:  Head: Normocephalic.  Neck: Normal range of motion.  Respiratory: Effort normal.  Musculoskeletal: Normal range of motion.  Neurological: She is alert and oriented to person, place, and time.  Skin: Skin is warm and dry.  Psychiatric: Her affect is angry and labile. Her  speech is tangential. She is agitated and aggressive. Thought content is paranoid. Cognition and memory are normal. She expresses impulsivity. She exhibits a depressed mood.    Review of Systems  Constitutional: Negative.   HENT: Negative.   Eyes: Negative.   Respiratory: Negative.   Cardiovascular: Negative.   Gastrointestinal: Negative.   Genitourinary: Negative.   Musculoskeletal: Negative.   Skin: Negative.   Neurological: Negative.    Endo/Heme/Allergies: Negative.   Psychiatric/Behavioral: Positive for depression and hallucinations. The patient is nervous/anxious.     Blood pressure 132/90, pulse 84, temperature 98.1 F (36.7 C), temperature source Oral, resp. rate 20, last menstrual period 06/25/2015, SpO2 98 %.There is no height or weight on file to calculate BMI.  General Appearance: Casual  Eye Contact:  Good  Speech:  Clear and Coherent and Pressured  Volume:  Increased  Mood:  Angry and Irritable  Affect:  Labile  Thought Process:  Disorganized  Orientation:  Full (Time, Place, and Person)  Thought Content:  Delusions and Hallucinations: Auditory  Suicidal Thoughts:  Yes.  without intent/plan  Homicidal Thoughts:  No  Memory:  Immediate;   Fair Recent;   Fair Remote;   Good  Judgement:  Impaired  Insight:  Lacking  Psychomotor Activity:  Increased  Concentration:  Concentration: Fair and Attention Span: Fair  Recall:  [employer] of Knowledge:  Fair  Language:  Fair  Akathisia:  No  Handed:  Right  AIMS (if indicated):     Assets:  Social Support  ADL's:  Intact  Cognition:  WNL  Sleep:   poor     Treatment Plan Summary:  Severe major depression, recurrent, with psychosis -Crisis stabilization -Daily contact with patient to assess and evaluate symptoms and progress in treatment and Medication management. -Continue Celexa 50m daily for depression, Haloperidol 19mbid for psychosis, and  Cogentin 0.5 mg bid for EPS prevention. Zyprexa 10 mg Every 8 hours PRN agitation -Individual counseling  Disposition: Recommend psychiatric Inpatient admission when medically cleared. Supportive therapy provided about ongoing stressors.  LOWaylan BogaNP 08/16/2015 11:00 AM Patient seen face-to-face for psychiatric evaluation, chart reviewed and case discussed with the physician extender and developed treatment plan. Reviewed the information documented and agree with the treatment plan. MoCorena PilgrimMD

## 2015-08-16 NOTE — ED Notes (Signed)
Pt refused vital signs. RN notified.  

## 2015-08-16 NOTE — ED Notes (Signed)
Patient denies SI, HI and AVH at this. Patient is restless at this time. Patient is repetitive with her questions. Attempted to discuss plan of care but patient is uninterested at this time. Encouragement and support provided and safety maintained. Q 15 min safety checks remain in place.

## 2015-08-16 NOTE — ED Notes (Signed)
[facility] transferred to [facility].  [place] [facility]  All belongings given to the officer.

## 2015-08-16 NOTE — BH Assessment (Signed)
[facility] Assessment Progress Note  Per Thedore MinsMojeed Akintayo, MD, this pt requires psychiatric hospitalization at this time.  Albin FellingCarla calls from [facility] to report that pt has been accepted to their facility by Dr Jeannine KittenFarah.  Nanine MeansJamison Lord, DNP concurs with this decision.  Pt presents under IVC initiated by her spouse and upheld by Dr Jannifer FranklinAkintayo, MD.  [facility], has been notified.  She agrees to call report to [phone].  Pt is to be transported via [employer].  Doylene Canninghomas Persais Ethridge, MA Triage Specialist [phone]

## 2015-08-16 NOTE — Progress Notes (Addendum)
CM spoke with [place] who [place].  CM discussed and provided written information to assist pt with determining choice for uninsured accepting pcps, discussed the importance of pcp vs EDP services for f/u care, www.needymeds.org, www.goodrx.com, discounted pharmacies and other [place] resources such as [facility] , [facility], affordable care act, financial assistance, uninsured dental services, [place] [facility], DSS and  health department  Reviewed resources for [place] uninsured accepting pcps like [facility], [facility] [address], [facility], [facility], local urgent care centers, [facility], [facility], general medical clinics, [facility] [place], [facility] plus others, medication resources, [facility] out patient [facility]ies and housing Pt voiced understanding and appreciation of resources provided   Provided [facility] contact information [facility] - noted [facility] forms on bedside   Pt in bed states she has difficulty seeing without her glasses No glasses in her room noted Pt inappropriate comments at intervals   entered in d/c instructions Please use the resources provided to you in emergency room by case manager to assist you're your choice of doctor for follow up Schedule an appointment as soon as possible for a visit As needed These [place] uninsured resources provide possible primary care providers, resources for discounted medications, housing, dental resources, affordable care act information, plus other resources [place]

## 2016-07-13 ENCOUNTER — Emergency Department (HOSPITAL_COMMUNITY)
Admission: EM | Admit: 2016-07-13 | Discharge: 2016-07-13 | Disposition: A | Discharge status: Home / Self Care | Payer: Self-pay | Attending: Emergency Medicine | Admitting: Emergency Medicine

## 2016-07-13 ENCOUNTER — Encounter (HOSPITAL_COMMUNITY): Payer: Self-pay | Admitting: Emergency Medicine

## 2016-07-13 ENCOUNTER — Emergency Department (HOSPITAL_COMMUNITY): Payer: Self-pay

## 2016-07-13 DIAGNOSIS — M25561 Pain in right knee: Secondary | ICD-10-CM | POA: Insufficient documentation

## 2016-07-13 DIAGNOSIS — Z79899 Other long term (current) drug therapy: Secondary | ICD-10-CM | POA: Insufficient documentation

## 2016-07-13 MED ORDER — DICLOFENAC SODIUM 50 MG PO TBEC: 50.0000 mg | DELAYED_RELEASE_TABLET | Freq: Two times a day (BID) | ORAL | 0 refills | Status: AC

## 2016-07-13 NOTE — ED Notes (Signed)
Pt walked back to pod C and pt did not want a wheelchair.

## 2016-07-13 NOTE — ED Triage Notes (Signed)
Pt c/o right knee pain x 2 months. Pt can not recall injury.

## 2016-07-13 NOTE — ED Notes (Signed)
Pt waiting on crutches from Orth[facility]

## 2016-07-13 NOTE — ED Notes (Signed)
Pt stable, ambulatory, states understanding of discharge instructions 

## 2016-07-13 NOTE — ED Provider Notes (Signed)
[facility]EM[facility]PT Provider Note   CSN: [id number] Arrival date & time: 07/13/16  1624   By signing my name below, I, Diona BrownerJennifer Gorman, attest that this documentation has been prepared under the direction and in the presence of [facility] M. Damian LeavellNeese, FNP.  Electronically Signed: Diona BrownerJennifer Gorman, ED Scribe. 07/13/16. 5:07 PM.   History   Chief Complaint Chief Complaint  Patient presents with  . Knee Pain    HPI Heidi Tate is a 51 y.o. female who presents to the Emergency Department complaining of gradually worsening, waxing and waning, right knee pain for the last few months. Associated sx include swelling of the right knee. She has taking ibuprofen at home with mild relief. Pt states she tried to stay off of it for the last two weeks but discomfort has worsened. Movement and baring weight to knee exacerbate her pain. Pt denies bilateral foot edema, nausea, vomiting, chills, and fever.  The history is provided by the patient. No language interpreter was used.  Knee Pain   This is a chronic problem. The current episode started more than 1 week ago. The problem occurs constantly. The problem has been gradually worsening. The pain is present in the right knee. The quality of the pain is described as constant. There has been no history of extremity trauma.    History reviewed. No pertinent past medical history.  Patient Active Problem List   Diagnosis Date Noted  . Severe recurrent major depression with psychotic features (HCC) 08/16/2015    History reviewed. No pertinent surgical history.  OB History    No data available       Home Medications    Prior to Admission medications   Medication Sig Start Date End Date Taking? Authorizing Provider  diclofenac (VOLTAREN) 50 MG EC tablet Take 1 tablet (50 mg total) by mouth 2 (two) times daily. 07/13/16   Janne NapoleonNeese, Hope M, NP    Family History No family history on file.  Social History Social History  Substance Use Topics  .  Smoking status: Never Smoker  . Smokeless tobacco: Never Used  . Alcohol use No     Allergies   Penicillins   Review of Systems Review of Systems  Constitutional: Negative for chills and fever.  HENT: Negative.   Respiratory: Negative for shortness of breath.   Gastrointestinal: Negative for nausea and vomiting.  Musculoskeletal: Positive for arthralgias and joint swelling.  Skin: Negative for wound.     Physical Exam Updated Vital Signs BP 130/60   Pulse 73   Temp 98.4 F (36.9 C)   Resp 18   Ht 5\' 5"  (1.651 m)   Wt 180 lb (81.6 kg)   LMP 06/29/2016   SpO2 97%   BMI 29.95 kg/m   Physical Exam  Constitutional: She is oriented to person, place, and time. She appears well-developed and well-nourished.  Eyes: EOM are normal.  Neck: Neck supple.  Pulmonary/Chest: Effort normal.  Abdominal: Soft. There is no tenderness.  Musculoskeletal: Normal range of motion.  Pulses are 2 +  FROM on ankle without pain.  Full passive ROM in knee without pain.  With extension at the knee, pain to the LCL.  Normal patella movement.  Slight swelling of the anterior knee.  Neurological: She is alert and oriented to person, place, and time. No cranial nerve deficit.  Skin: Skin is warm and dry.  Nursing note and vitals reviewed.    ED Treatments / Results  DIAGNOSTIC STUDIES: Oxygen Saturation is 97% on  RA, normal by my interpretation.   COORDINATION OF CARE: 5:05 PM-Discussed next steps with pt. Pt verbalized understanding and is agreeable with the plan.    Labs (all labs ordered are listed, but only abnormal results are displayed) Labs Reviewed - No data to display  Radiology Dg Knee Complete 4 Views Right  Result Date: 07/13/2016 CLINICAL DATA:  Anterior knee pain for 2 months, no known injury, initial encounter EXAM: RIGHT KNEE - COMPLETE 4+ VIEW COMPARISON:  None. FINDINGS: Degenerative changes of the knee joint is noted most marked in the medial joint space. No  joint effusion is seen. No acute fracture or dislocation is seen. IMPRESSION: Degenerative changes without acute abnormality. Electronically Signed   By: Alcide Clever M.D.   On: 07/13/2016 18:17    Procedures Procedures (including critical care time)  Medications Ordered in ED Medications - No data to display   Initial Impression / Assessment and Plan / ED Course  I have reviewed the triage vital signs and the nursing notes.  Pertinent imaging results that were available during my care of the patient were reviewed by me and considered in my medical decision making (see chart for details).   Final Clinical Impressions(s) / ED Diagnoses  51 y.o. female with right knee pain stable for d/c without fracture or dislocation noted on x-ray and patient has no focal neuro deficits. Will treat with NSAIDS, knee sleeve applied prior to d/c and patient given crutches with instructions on use.   Final diagnoses:  Acute pain of right knee    New Prescriptions New Prescriptions   DICLOFENAC (VOLTAREN) 50 MG EC TABLET    Take 1 tablet (50 mg total) by mouth 2 (two) times daily.   I personally performed the services described in this documentation, which was scribed in my presence. The recorded information has been reviewed and is accurate.     Kerrie Buffalo [place], [place] 07/13/16 1831    Benjiman Core, MD 07/13/16 [phone]

## 2018-05-19 IMAGING — CR DG KNEE COMPLETE 4+V*R*
4 series · 5 of 5 positions shown · non-contrast
Comparison: None.

CLINICAL DATA: Anterior knee pain for 2 months, no known injury,
initial encounter

EXAM:
RIGHT KNEE - COMPLETE 4+ VIEW

[knee ap]
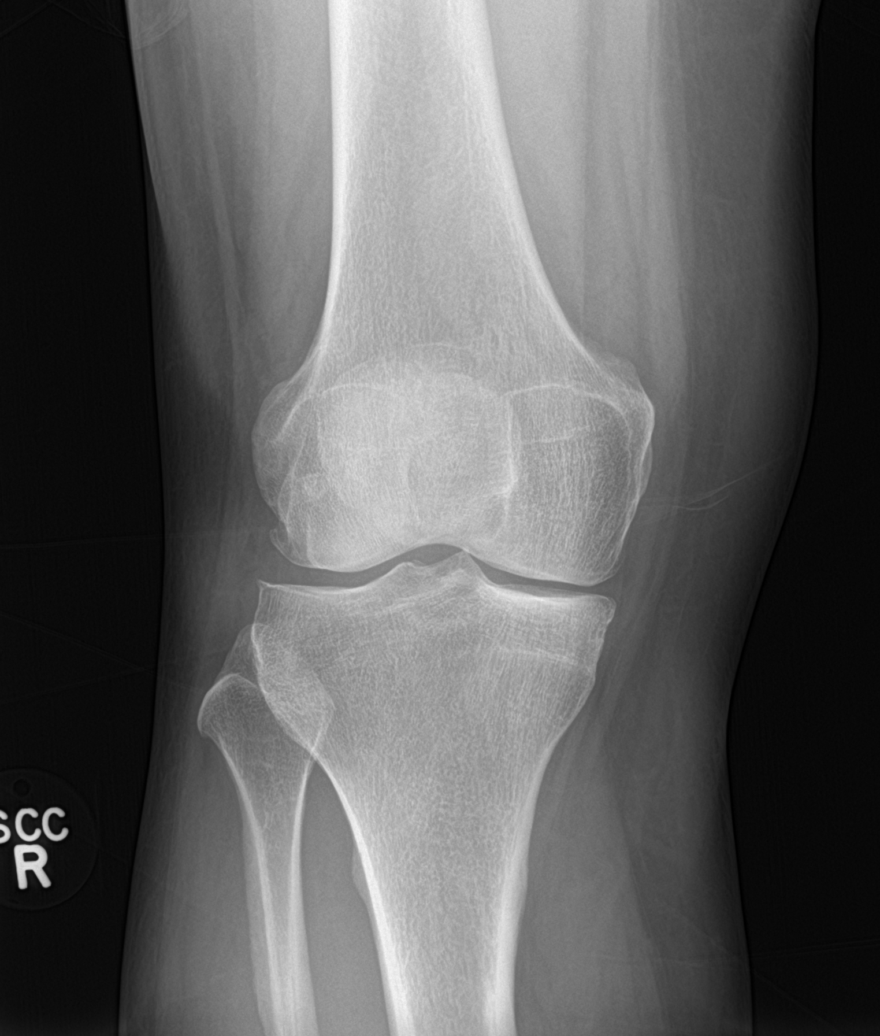

[Series 3: knee lat · 0.14mm/px · 2 of 2 slices shown]
[im 1/2]
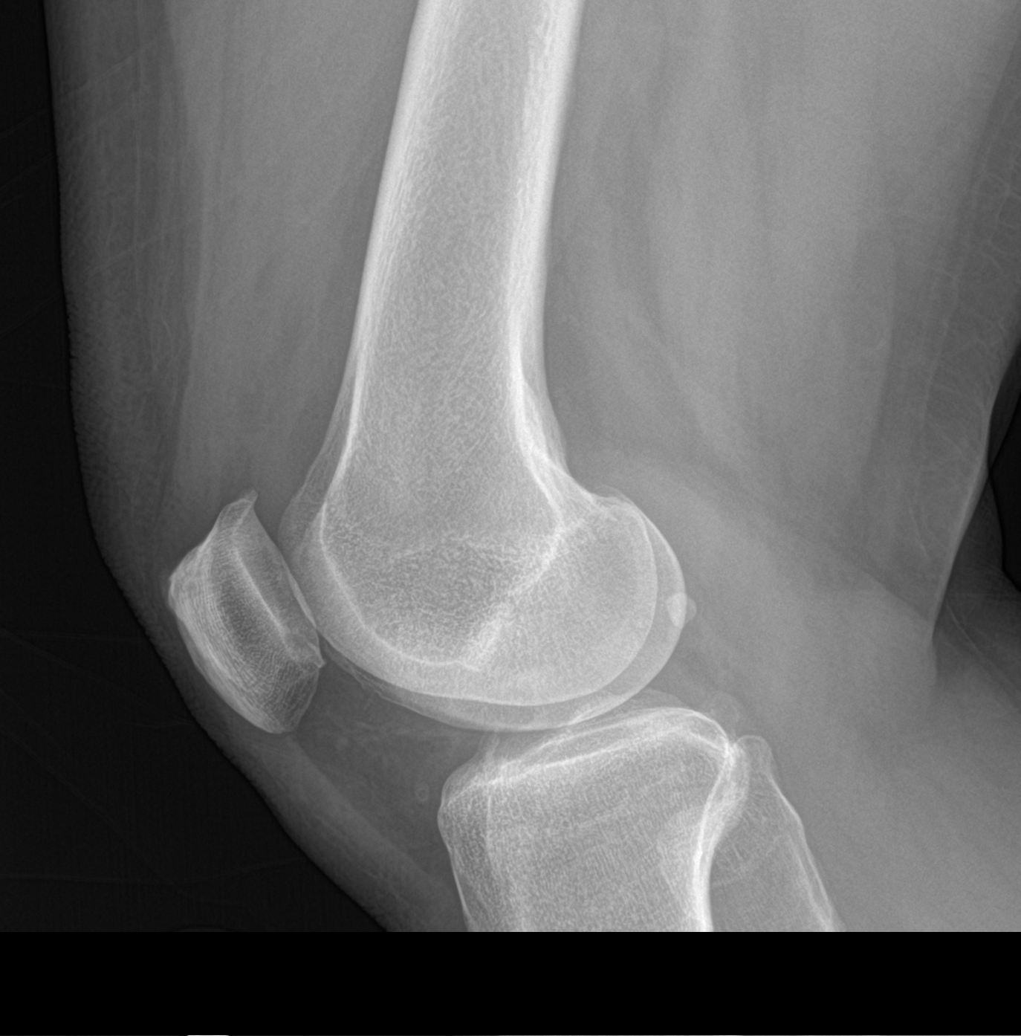
[im 2/2]
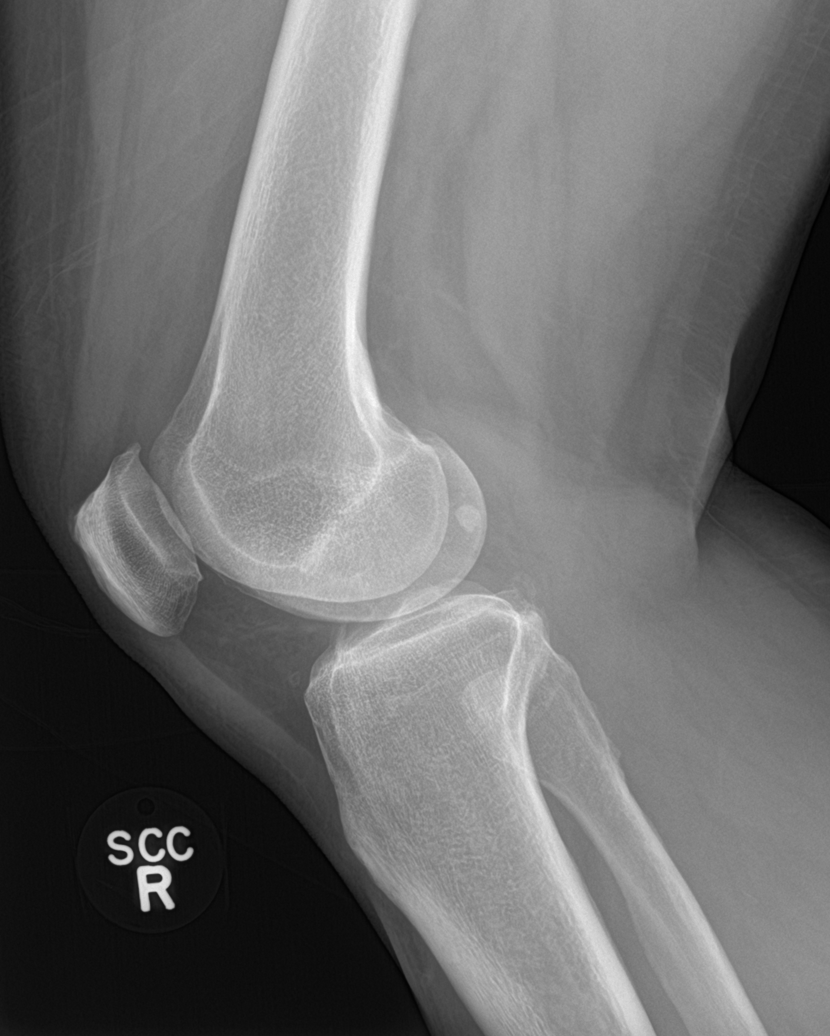

[knee obl (1 of 2)]
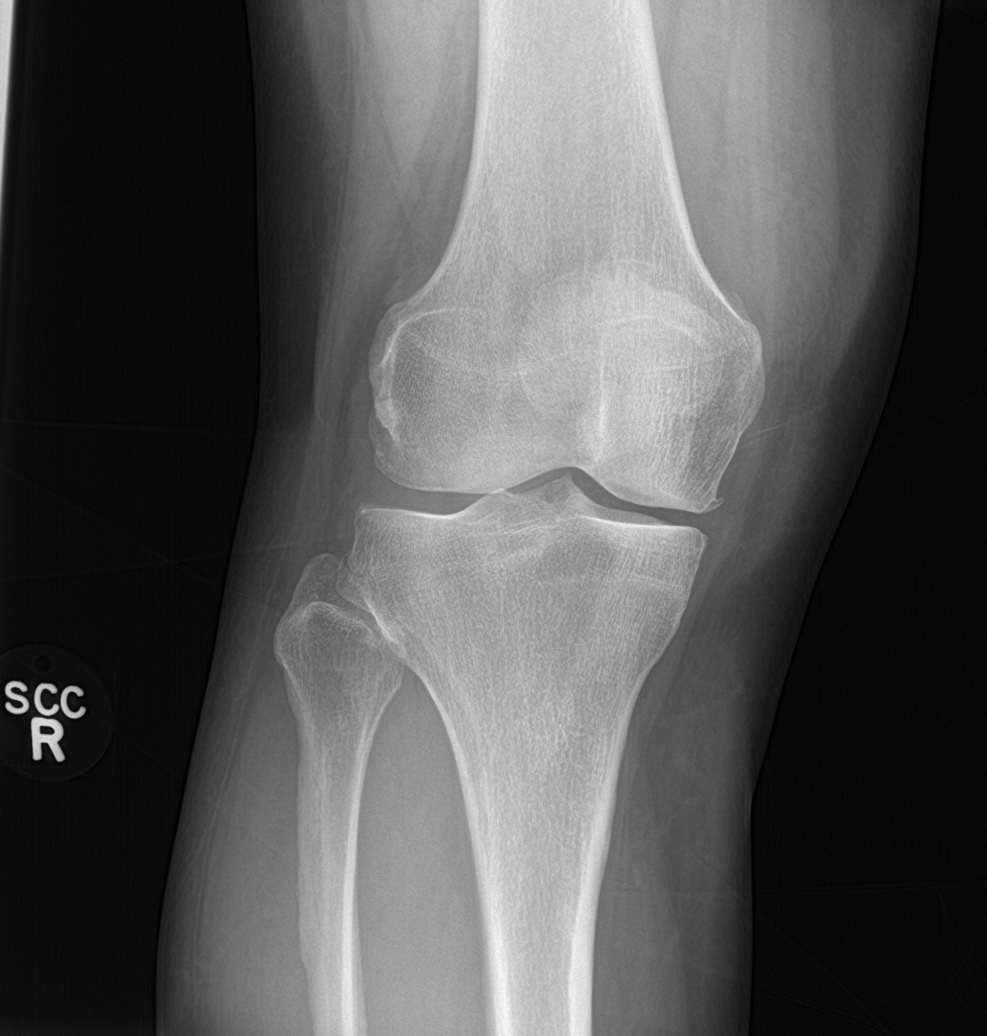

[knee obl (2 of 2)]
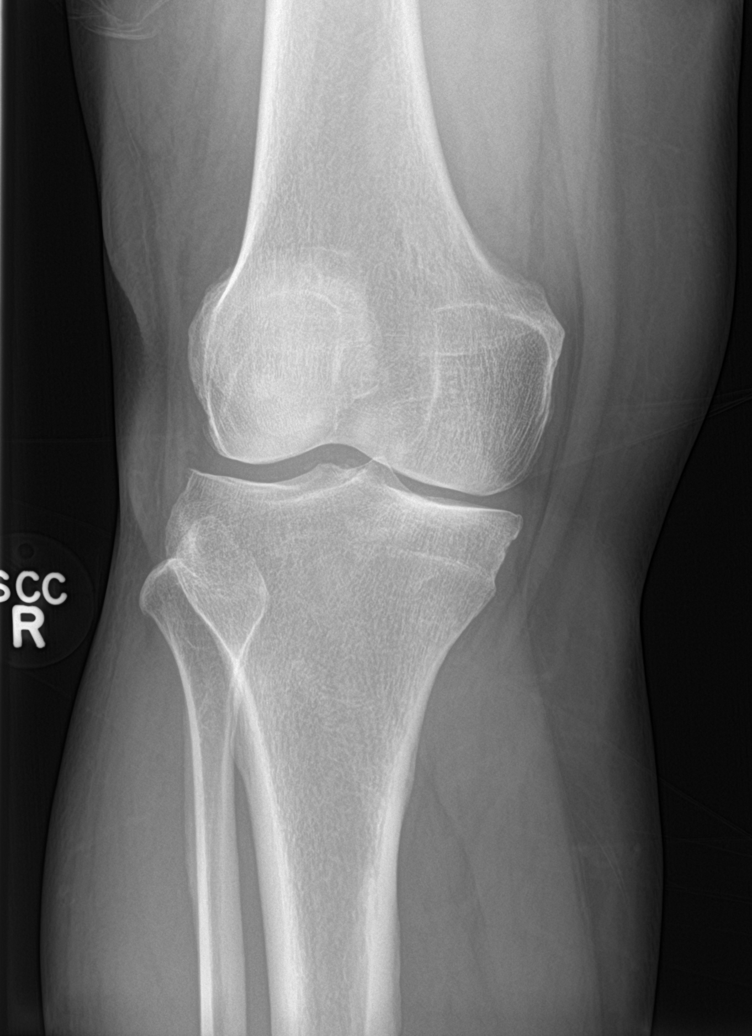

[5 of 5 positions shown; findings below may reference images not displayed]

FINDINGS: Degenerative changes of the knee joint is noted most marked in the
medial joint space. No joint effusion is seen. No acute fracture or
dislocation is seen.
IMPRESSION: Degenerative changes without acute abnormality.
